# Patient Record
Sex: Male | Born: 1940 | Race: White | Hispanic: No | Marital: Married
Health system: Southern US, Community
[De-identification: ages and names within clinical notes are randomized; demographics above are authoritative.]

---

## 1999-11-13 ENCOUNTER — Inpatient Hospital Stay (HOSPITAL_COMMUNITY): Admission: EM | Admit: 1999-11-13 | Discharge: 1999-11-17 | Payer: Self-pay | Admitting: *Deleted

## 2002-03-19 ENCOUNTER — Emergency Department (HOSPITAL_COMMUNITY): Admission: EM | Admit: 2002-03-19 | Discharge: 2002-03-19 | Payer: Self-pay | Admitting: Emergency Medicine

## 2003-12-31 ENCOUNTER — Emergency Department (HOSPITAL_COMMUNITY): Admission: EM | Admit: 2003-12-31 | Discharge: 2004-01-01 | Payer: Self-pay | Admitting: Emergency Medicine

## 2007-07-28 ENCOUNTER — Ambulatory Visit: Payer: Self-pay | Admitting: Gastroenterology

## 2007-08-14 ENCOUNTER — Ambulatory Visit: Payer: Self-pay | Admitting: Gastroenterology

## 2010-11-06 NOTE — Discharge Summary (Signed)
. [facility]  Patient:    Cody Bates, Cody Bates                     MRN: [id number] Adm. Date:  [id number] Disc. Date: 11/16/99 Attending:  Learta Codding Dictator:   Leonides Cave, P.A. CC:         Lewayne Bunting, M.D., [facility] Cardiology             Bradd Canary, M.D., Primary Care                           Discharge Summary  DISCHARGE DIAGNOSES: 1. Noncardiac chest pain. (Patient with no coronary artery disease on heart    catheterization on 11/16/99). 2. Remote tobacco.  BRIEF HISTORY:  [age]-year-old male with no prior cardiac history who presents to [facility] on 11/13/99 with new onset of chest pain, which radiates to the back and having associated diaphoresis.  While at work, around 7 a.m. on the morning of admission, he developed sudden, sharp, mid-sternal chest pain. Duration was several hours and he finally decided to come to the emergency department. Admission EKG revealed normal sinus rhythm of 64, normal axis, and nonspecific T changes.  His third EKG, however, has slightly elevated ST in V2 and V3.  Chest CT scan was negative for pulmonary emboli and initial CPK and CK/MBs were negative; however, his troponin I was elevated at 0.24.  Patient was admitted for possible ischemic heart disease and was kept stable over the weekend at [facility].  HOSPITAL COURSE:  Patient was transferred to [facility] on the morning of 11/16/99 and was taken to the cath lab by Dr. Lewayne Bunting, [facility] cardiologist.  Results are LAD, left circ, and RCA all with no flow-limiting coronary artery disease.  Ejection fraction was normal at 60%.  Patient was subsequently discharged home later on the evening of 11/16/99 in stable condition.  Patient was discharged home on no new medications and he was on no medications at the time of admission. Patient was told to undergo no heavy lifting, driving, or sexual activity for two hours  post-catheterization.  He was told that if bleeding or swelling occurred t the catheterization site, he is to call [facility] cardiology office immediately. He was told to call the [facility] cardiology office for a P.A. followup in one week or groin check and call his primary care physician, Dr. Artis Flock, in two to three weeks for a post-hospital followup as well. DD:  11/16/99 TD:  11/16/99 Job: [id number]

## 2010-11-06 NOTE — Cardiovascular Report (Signed)
[place]. [facility]  Patient:    Cody Bates, Cody Bates                       MRN: [id number] Proc. Date: 11/16/99 Attending:  Lewayne Bunting, M.D. CC:         Luanna Salk, M.D., phone [phone]             Gerrit Friends. Dietrich Pates, M.D. [facility]                        Cardiac Catheterization  DATE OF BIRTH:  [birth year]  REFERRING PHYSICIAN:  Quita Skye. Artis Flock, M.D.  CARDIOLOGIST:  Gerrit Friends. Dietrich Pates, M.D.  PROCEDURES PERFORMED: 1. Left heart catheterization. 2. Ventriculography.  DIAGNOSES: 1. No flow-limiting epicardial coronary artery disease. 2. Separate ostia for left anterior descending and circumflex coronary artery. 3. Normal left ventricular filling pressures.  HISTORY:  Mr. Gravlin is a 70 year old white male with no significant past medical history.  The patient does have a positive family history for coronary artery disease.  He was admitted to the [facility] Emergency Room with atypical chest pain and positive troponin markers.  The patient is now being referred for diagnostic catheterization to access his coronary anatomy.  DESCRIPTION OF PROCEDURE:  After informed consent was obtained, the patient was brought to the catheterization laboratory.  The right groin was sterilely prepped and draped.  Lidocaine 1% was used to infiltrate the right groin, and a 6 French arterial sheath was placed using the modified Seldinger technique. Subsequently a 6 [place] and JR4 catheters were used to engage the left and right coronary arteries.  However, the left anterior descending artery and circumflex coronary artery arose from separate ostia.  Subsequently the circumflex was selectively cannulated with a JL5 catheter.  After selective angiographies attention was turned to ventriculography.  A 6 French pigtail catheter was advanced via the femoral artery and placed in the left ventricle. Appropriate left-sided hemodynamics were obtained.  A left ventriculogram  was then performed using power injections of contrast.  The pigtail catheter was then pulled back.  At the termination of the case, the catheters and sheaths were removed and manual pressure applied until adequate hemostasis was achieved.  The patient tolerated the procedure well and was transferred to the short-stay unit in stable condition.  FINDINGS:  HEMODYNAMICS:  Left ventricular pressure 114 mmHg.  The aortic pressure 102/64 mmHg.  LEFT VENTRICULOGRAM:  Ejection fraction 60% without wall motion abnormalities. This was performed in single plane RAO projection.  SELECTIVE CORONARY ANGIOGRAPHY:  Of note is again a separate coronary ostia on the left side.  Left anterior descending artery had no flow-limiting epicardial coronary artery disease.  The left circumflex coronary artery had no flow-limiting coronary artery disease.  Right coronary artery was right dominant with no flow-limiting coronary artery disease.  CONCLUSIONS: 1. No flow-limiting epicardial coronary artery disease. 2. Normal left-sided filling pressures. 3. No obvious dissection of the proximal aorta.  RECOMMENDATIONS:  The patients chest pain syndrome was rather atypical.  He did have negative CK and CK-MB markers and mildly elevated troponin levels. The patient was ruled out for pulmonary embolus with a negative spiral CT scan.  However, I have recommended lower extremity venous Dopplers this afternoon.  If these are negative, the patient could be discharged. There is no evidence of flow-limiting epicardial coronary artery disease at the present time.  The patient should continue to modify his risk  factors for CAD. DD:  11/16/99 TD:  11/17/99 Job: [id number]

## 2010-11-06 NOTE — Discharge Summary (Signed)
. [facility]  Patient:    Cody Bates, Cody Bates                       MRN: [id number] Adm. Date:  11/13/99 Disc. Date: 11/17/99 Attending:  Gerrit Friends. Dietrich Pates, M.D. [facility] Dictator:   Gene Serpe, P.A. CC:         Quita Skye. Artis Flock, M.D.                           Discharge Summary  PROCEDURES:  Cardiac catheterization  Nov 16, 1999.  REASON FOR ADMISSION: [name] is a 70 year old male without prior history of heart disease, who initially presented to [facility] with new onset sharp chest pain exacerbated by deep inspiration.  Initial workup involved chest CT scan which was negative for pulmonary embolus.  No acute changes were noted on EKG; however, troponin levels were abnormal (0.24) and the patient was admitted for rule out MI and further diagnostic evaluation.  LABORATORY DATA:  Room ABG:  pH 7.4, pCO2 40, pO2 77, HCO3 25.  Admission CBC normal.  Metabolic profile on admission normal.  Cardiac enzymes:  CK/MB negative times three; serial troponin I:  0.24, 0.20, 0.16.   Lipid profile: Cholesterol 209, triglyceride 274, HDL 27, LDL 127.  Admission chest x-ray:  No acute disease.  HOSPITAL COURSE:  Following admission, serial cardiac enzymes revealed negative CPK levels with abnormal troponin I levels (peak 0.24).  The patient was maintained on Lovenox in preparation to proceed with coronary angiogram. Initial workup at [facility] notable for negative chest CT scan and no evidence of DVT.  Cardiac catheterization on Nov 16, 1999 by Dr. Andee Lineman (see report for full details), revealed normal coronary arteries/normal left ventricle.  The patient was cleared for discharge the following morning and will follow up in one week for groin check.  No new medications were added.  DISCHARGE MEDICATIONS:  Darvocet p.r.n.  DISCHARGE INSTRUCTIONS:  The patient is to refrain from heavy lifting/driving times two days and is to maintain low  fat/cholesterol diet. He is to call our office if there is any swelling/bleeding in the groin.  The patient is instructed to follow up with the [facility] in one week for groin check.  Will follow up with Dr. Artis Flock in 2-3 weeks.  DISCHARGE DIAGNOSES: 1. Noncardiac chest pain.    a. Normal coronary angiogram Nov 16, 1999.    b. Abnormal troponin I levels. 2. Mild hypoxemia.    a. Negative chest computerized tomography for pulmonary embolus. 3. Remote tobacco. DD:  11/17/99 TD:  11/17/99 Job: [id number]

## 2015-01-29 ENCOUNTER — Encounter: Payer: Self-pay | Admitting: Gastroenterology

## 2017-07-21 ENCOUNTER — Encounter: Payer: Self-pay | Admitting: Gastroenterology

## 2019-04-27 ENCOUNTER — Other Ambulatory Visit: Payer: Self-pay

## 2019-04-27 DIAGNOSIS — Z20822 Contact with and (suspected) exposure to covid-19: Secondary | ICD-10-CM

## 2019-04-28 LAB — NOVEL CORONAVIRUS, NAA: SARS-CoV-2, NAA: NOT DETECTED

## 2020-02-22 ENCOUNTER — Encounter (HOSPITAL_COMMUNITY): Payer: Self-pay | Admitting: Physician Assistant

## 2020-02-22 ENCOUNTER — Other Ambulatory Visit: Payer: Self-pay

## 2020-02-22 ENCOUNTER — Emergency Department (HOSPITAL_COMMUNITY)
Admission: EM | Admit: 2020-02-22 | Discharge: 2020-02-22 | Disposition: A | Discharge status: Home / Self Care | Payer: Medicare Other | Attending: Emergency Medicine | Admitting: Emergency Medicine

## 2020-02-22 ENCOUNTER — Emergency Department (HOSPITAL_COMMUNITY): Payer: Medicare Other

## 2020-02-22 DIAGNOSIS — E860 Dehydration: Secondary | ICD-10-CM | POA: Diagnosis not present

## 2020-02-22 DIAGNOSIS — Z7984 Long term (current) use of oral hypoglycemic drugs: Secondary | ICD-10-CM | POA: Diagnosis not present

## 2020-02-22 DIAGNOSIS — Z79899 Other long term (current) drug therapy: Secondary | ICD-10-CM | POA: Insufficient documentation

## 2020-02-22 DIAGNOSIS — U071 COVID-19: Secondary | ICD-10-CM | POA: Diagnosis not present

## 2020-02-22 DIAGNOSIS — I959 Hypotension, unspecified: Secondary | ICD-10-CM | POA: Diagnosis present

## 2020-02-22 DIAGNOSIS — E119 Type 2 diabetes mellitus without complications: Secondary | ICD-10-CM | POA: Diagnosis not present

## 2020-02-22 LAB — PROCALCITONIN: Procalcitonin: <0.1 ng/mL

## 2020-02-22 LAB — CBC WITH DIFFERENTIAL/PLATELET
Abs Immature Granulocytes: 0.12 10*3/uL — ABNORMAL HIGH (ref 0.00–0.07)
Basophils Absolute: 0 10*3/uL (ref 0.0–0.1)
Basophils Relative: 0 %
Eosinophils Absolute: 0 10*3/uL (ref 0.0–0.5)
Eosinophils Relative: 0 %
HCT: 35.7 % — ABNORMAL LOW (ref 39.0–52.0)
Hemoglobin: 11.8 g/dL — ABNORMAL LOW (ref 13.0–17.0)
Immature Granulocytes: 1 %
Lymphocytes Relative: 11 %
Lymphs Abs: 1 10*3/uL (ref 0.7–4.0)
MCH: 30.7 pg (ref 26.0–34.0)
MCHC: 33.1 g/dL (ref 30.0–36.0)
MCV: 93 fL (ref 80.0–100.0)
Monocytes Absolute: 0.8 10*3/uL (ref 0.1–1.0)
Monocytes Relative: 10 %
Neutro Abs: 6.7 10*3/uL (ref 1.7–7.7)
Neutrophils Relative %: 78 %
Platelets: 249 10*3/uL (ref 150–400)
RBC: 3.84 MIL/uL — ABNORMAL LOW (ref 4.22–5.81)
RDW: 12.9 % (ref 11.5–15.5)
WBC: 8.7 10*3/uL (ref 4.0–10.5)
nRBC: 0 % (ref 0.0–0.2)

## 2020-02-22 LAB — URINALYSIS, ROUTINE W REFLEX MICROSCOPIC
Bilirubin Urine: NEGATIVE
Glucose, UA: NEGATIVE mg/dL
Hgb urine dipstick: NEGATIVE
Ketones, ur: 5 mg/dL — AB
Leukocytes,Ua: NEGATIVE
Nitrite: NEGATIVE
Protein, ur: NEGATIVE mg/dL
Specific Gravity, Urine: 1.006 (ref 1.005–1.030)
pH: 7 (ref 5.0–8.0)

## 2020-02-22 LAB — COMPREHENSIVE METABOLIC PANEL
ALT: 38 U/L (ref 0–44)
AST: 39 U/L (ref 15–41)
Albumin: 2.5 g/dL — ABNORMAL LOW (ref 3.5–5.0)
Alkaline Phosphatase: 137 U/L — ABNORMAL HIGH (ref 38–126)
Anion gap: 13 (ref 5–15)
BUN: 7 mg/dL — ABNORMAL LOW (ref 8–23)
CO2: 18 mmol/L — ABNORMAL LOW (ref 22–32)
Calcium: 8.2 mg/dL — ABNORMAL LOW (ref 8.9–10.3)
Chloride: 101 mmol/L (ref 98–111)
Creatinine, Ser: 1.06 mg/dL (ref 0.61–1.24)
GFR calc Af Amer: >60 mL/min (ref >60)
GFR calc non Af Amer: >60 mL/min (ref >60)
Glucose, Bld: 135 mg/dL — ABNORMAL HIGH (ref 70–99)
Potassium: 4.1 mmol/L (ref 3.5–5.1)
Sodium: 132 mmol/L — ABNORMAL LOW (ref 135–145)
Total Bilirubin: 1.2 mg/dL (ref 0.3–1.2)
Total Protein: 6.5 g/dL (ref 6.5–8.1)

## 2020-02-22 LAB — LACTIC ACID, PLASMA: Lactic Acid, Venous: 1 mmol/L (ref 0.5–1.9)

## 2020-02-22 LAB — CBG MONITORING, ED: Glucose-Capillary: 123 mg/dL — ABNORMAL HIGH (ref 70–99)

## 2020-02-22 LAB — LACTATE DEHYDROGENASE: LDH: 172 U/L (ref 98–192)

## 2020-02-22 LAB — C-REACTIVE PROTEIN: CRP: 19.7 mg/dL — ABNORMAL HIGH (ref <1.0)

## 2020-02-22 LAB — D-DIMER, QUANTITATIVE: D-Dimer, Quant: 1.1 ug/mL-FEU — ABNORMAL HIGH (ref 0.00–0.50)

## 2020-02-22 LAB — FIBRINOGEN: Fibrinogen: 713 mg/dL — ABNORMAL HIGH (ref 210–475)

## 2020-02-22 LAB — TRIGLYCERIDES: Triglycerides: 97 mg/dL (ref <150)

## 2020-02-22 LAB — FERRITIN: Ferritin: 883 ng/mL — ABNORMAL HIGH (ref 24–336)

## 2020-02-22 MED ORDER — LACTATED RINGERS IV BOLUS: 2000.0000 mL | Freq: Once | INTRAVENOUS | Status: DC

## 2020-02-22 MED ORDER — VANCOMYCIN HCL IN DEXTROSE 1-5 GM/200ML-% IV SOLN: 1000.0000 mg | Freq: Two times a day (BID) | INTRAVENOUS | Status: DC

## 2020-02-22 MED ORDER — SODIUM CHLORIDE 0.9 % IV SOLN: 2.0000 g | Freq: Once | INTRAVENOUS | Status: DC

## 2020-02-22 MED ORDER — LACTATED RINGERS IV BOLUS
1000.0000 mL | Freq: Once | INTRAVENOUS | Status: AC
  Administered 2020-02-22: 1000 mL via INTRAVENOUS

## 2020-02-22 MED ORDER — VANCOMYCIN HCL IN DEXTROSE 1-5 GM/200ML-% IV SOLN: 1000.0000 mg | Freq: Once | INTRAVENOUS | Status: DC

## 2020-02-22 MED ORDER — SODIUM CHLORIDE 0.9 % IV SOLN: 2.0000 g | Freq: Three times a day (TID) | INTRAVENOUS | Status: DC

## 2020-02-22 MED ORDER — VANCOMYCIN HCL 1500 MG/300ML IV SOLN
1500.0000 mg | Freq: Once | INTRAVENOUS | Status: DC
  Filled 2020-02-22: qty 300

## 2020-02-22 MED ORDER — SODIUM CHLORIDE 0.9 % IV BOLUS
500.0000 mL | Freq: Once | INTRAVENOUS | Status: AC
  Administered 2020-02-22: 500 mL via INTRAVENOUS

## 2020-02-22 NOTE — Discharge Instructions (Signed)
Please follow up with your primary care provider within 2-3 days for re-evaluation of your symptoms. If you do not have a primary care provider, information for a healthcare clinic has been provided for you to make arrangements for follow up care. Please return to the emergency department for any new or worsening symptoms. ° °

## 2020-02-22 NOTE — Progress Notes (Signed)
Pharmacy Antibiotic Note  Cody Bates is a 79 y.o. male admitted on 02/22/2020 with sepsis.  Pharmacy has been consulted for vancomycin and cefepime dosing.  Plan: Vancomycin 1500 mg IV x 1, then 1000 mg Iv every 12 hours (target vancomycin trough 15-20) Cefepime 2g IV every 8 hours Monitor renal function, Cx and clinical progression to narrow Vancomycin level as needed      Temp (24hrs), Avg:98.6 F (37 C), Min:98.6 F (37 C), Max:98.6 F (37 C)  Recent Labs  Lab 02/22/20 1056  WBC 8.7  CREATININE 1.06  LATICACIDVEN 1.0    CrCl cannot be calculated (Unknown ideal weight.).    No Known Allergies  Daylene Posey, PharmD Clinical Pharmacist ED Pharmacist Phone # [phone] 02/22/2020 12:59 PM

## 2020-02-22 NOTE — ED Triage Notes (Signed)
Arrived via EMS from home. Reported covid + x 10 days; c/o low BP and feels dehydrated.

## 2020-02-22 NOTE — ED Triage Notes (Signed)
EMS reported initial systolic BP of 82 mm hg and improved to 103/60 afteer 400 ml NS bolus.

## 2020-02-22 NOTE — ED Notes (Signed)
Oxygen 95% while ambulating  

## 2020-02-22 NOTE — ED Provider Notes (Signed)
[facility] EMERGENCY DEPARTMENT Provider Note   CSN: [id number] Arrival date & time: 02/22/20  1009     History Chief Complaint  Patient presents with  . Hypotension    Cody Bates is a 79 y.o. male.  HPI   79 year old male with a history of arthritis, cataracts, diabetes, who presents to the emergency department today for evaluation of low blood pressure.  Additional history obtained from EMS.  They state patient is is Covid positive and on day 10 of symptoms.  He states they were called out for hypotension.  On their arrival BP was in the 80s systolic.  They gave 400 cc of fluids and sats improved to 100 systolic.  His O2 sats were 92% on room air.  He was not complaining of any chest pain or shortness of breath.  Patient tells me that he feels dehydrated and like he needs fluids.  He has had no vomiting or diarrhea.  He denies any chest pain, shortness of breath.  He has had a nonproductive cough.  He denies any fevers.  History reviewed. No pertinent past medical history.  There are no problems to display for this patient.   History reviewed. No pertinent surgical history.     History reviewed. No pertinent family history.  Social History   Tobacco Use  . Smoking status: Not on file  Substance Use Topics  . Alcohol use: Not on file  . Drug use: Not on file    Home Medications Prior to Admission medications   Medication Sig Start Date End Date Taking? Authorizing Provider  Carboxymethylcellulose Sodium 0.25 % SOLN Apply 1 drop to eye daily as needed.   Yes [provider]  Cholecalciferol (VITAMIN D3) 1.25 MG (50000 UT) CAPS Take 1 capsule by mouth daily.   Yes [provider]  metFORMIN (GLUCOPHAGE) 500 MG tablet Take 500 mg by mouth in the morning and at bedtime.   Yes [provider]  Multiple Vitamin (MULTI-VITAMIN) tablet Take 1 tablet by mouth daily.   Yes [provider]    Allergies    Patient  has no known allergies.  Review of Systems   Review of Systems  Constitutional: Positive for fatigue. Negative for fever.  HENT: Negative for ear pain and sore throat.   Eyes: Negative for pain and visual disturbance.  Respiratory: Positive for cough. Negative for shortness of breath.   Cardiovascular: Negative for chest pain.  Gastrointestinal: Negative for abdominal pain, constipation, diarrhea, nausea and vomiting.  Genitourinary: Negative for dysuria and hematuria.  Musculoskeletal: Negative for back pain.  Skin: Negative for rash.  Neurological: Negative for headaches.  All other systems reviewed and are negative.   Physical Exam Updated Vital Signs BP 121/69   Pulse 81   Temp 98.6 F (37 C) (Oral)   Resp (!) 24   SpO2 95%   Physical Exam Vitals and nursing note reviewed.  Constitutional:      Appearance: He is well-developed.     Comments: Thin appearing male  HENT:     Head: Normocephalic and atraumatic.  Eyes:     Conjunctiva/sclera: Conjunctivae normal.  Cardiovascular:     Rate and Rhythm: Normal rate and regular rhythm.     Heart sounds: Normal heart sounds. No murmur heard.   Pulmonary:     Effort: Pulmonary effort is normal. No respiratory distress.     Breath sounds: Rales (bibasilar rales (R>L)) present.  Abdominal:     General: Bowel sounds  are normal.     Palpations: Abdomen is soft.     Tenderness: There is no abdominal tenderness.  Musculoskeletal:     Cervical back: Neck supple.  Skin:    General: Skin is warm and dry.  Neurological:     Mental Status: He is alert.     ED Results / Procedures / Treatments   Labs (all labs ordered are listed, but only abnormal results are displayed) Labs Reviewed  CBC WITH DIFFERENTIAL/PLATELET - Abnormal; Notable for the following components:      Result Value   RBC 3.84 (*)    Hemoglobin 11.8 (*)    HCT 35.7 (*)    Abs Immature Granulocytes 0.12 (*)    All other components within normal limits   COMPREHENSIVE METABOLIC PANEL - Abnormal; Notable for the following components:   Sodium 132 (*)    CO2 18 (*)    Glucose, Bld 135 (*)    BUN 7 (*)    Calcium 8.2 (*)    Albumin 2.5 (*)    Alkaline Phosphatase 137 (*)    All other components within normal limits  D-DIMER, QUANTITATIVE (NOT AT [facility]) - Abnormal; Notable for the following components:   D-Dimer, Quant 1.10 (*)    All other components within normal limits  FERRITIN - Abnormal; Notable for the following components:   Ferritin 883 (*)    All other components within normal limits  FIBRINOGEN - Abnormal; Notable for the following components:   Fibrinogen 713 (*)    All other components within normal limits  C-REACTIVE PROTEIN - Abnormal; Notable for the following components:   CRP 19.7 (*)    All other components within normal limits  URINALYSIS, ROUTINE W REFLEX MICROSCOPIC - Abnormal; Notable for the following components:   Ketones, ur 5 (*)    All other components within normal limits  CBG MONITORING, ED - Abnormal; Notable for the following components:   Glucose-Capillary 123 (*)    All other components within normal limits  CULTURE, BLOOD (ROUTINE X 2)  CULTURE, BLOOD (ROUTINE X 2)  URINE CULTURE  LACTIC ACID, PLASMA  PROCALCITONIN  LACTATE DEHYDROGENASE  TRIGLYCERIDES    EKG EKG Interpretation  Date/Time:  Friday February 22 2020 10:29:58 EDT Ventricular Rate:  73 PR Interval:    QRS Duration: 93 QT Interval:  374 QTC Calculation: 413 R Axis:   51 Text Interpretation: Sinus rhythm Abnormal R-wave progression, early transition No significant change since last tracing Confirmed by Alvira Monday ([id number]) on 02/22/2020 12:38:06 PM   Radiology DG Chest Port 1 View  Result Date: 02/22/2020 CLINICAL DATA:  COVID EXAM: PORTABLE CHEST 1 VIEW COMPARISON:  None. FINDINGS: The heart size and mediastinal contours are within normal limits. No consolidation. Mild interstitial prominence. The visualized skeletal  structures are unremarkable. IMPRESSION: Mild nonspecific interstitial thickening, which may represent chronic bronchitic changes, mild interstitial edema, or atypical infection. No consolidation. Electronically Signed   By: Feliberto Harts MD   On: 02/22/2020 10:50    Procedures Procedures (including critical care time)  Medications Ordered in ED Medications  sodium chloride 0.9 % bolus 500 mL (0 mLs Intravenous Stopped 02/22/20 1300)  lactated ringers bolus 1,000 mL (1,000 mLs Intravenous New Bag/Given 02/22/20 1530)    ED Course  I have reviewed the triage vital signs and the nursing notes.  Pertinent labs & imaging results that were available during my care of the patient were reviewed by me and considered in my medical decision making (see  chart for details).    MDM Rules/Calculators/A&P                          79 y/o M with COVID presenting for eval of dehydration and hypotension  Noted to have systolic BP in the 80s with EMS. Received 400cc bolus with them and BP improved to low 100s. sats 96 on RA.   Reviewed/interpreted labs CBC w/o leukocytosis, mild anemia present CMP with mild hyponatremia, bicarb low at 18, creatinine normal and LFTs are normal. Lactic negative Inflammatory markers are somewhat elevated UA with ketonuria but no uti Blood cultures obtained  EKG with NSR, abnormal rwave progression  CXR personally reviewed/interpreted - Mild nonspecific interstitial thickening, which may represent chronic bronchitic changes, mild interstitial edema, or atypical infection. No consolidation.  I reassessed the patient after he received IV fluids and he states he is feeling much better and back to baseline at this time.  He has been able to tolerate Sprite as well as a sandwich without any difficulty.  I suspect that his symptoms today are related simply to dehydration from being ill with Covid.  He is show no evidence of hypoxia today and was able to ambulate with sats  above 94% on room air.  He does not complaining of any chest pain or shortness of breath and he looks well after receiving fluids.  I feel that he is appropriate for discharge.  He does not qualify for monoclonal antibodies as he is out of the window for receiving this.  Have recommended continued supportive therapy and that he push fluids for the next several days.  Have advised close follow-up with his provider at the [facility] and strict return precautions.  He voices understanding of plan and reasons to return.  Questions answered patient stable for discharge.   Case was discussed with Dr. Dalene Seltzer who is in agreement with the plan for discharge.  Final Clinical Impression(s) / ED Diagnoses Final diagnoses:  COVID-19  Dehydration    Rx / DC Orders ED Discharge Orders    None       Karrie Meres, PA-C 02/22/20 1626    Alvira Monday, MD 02/25/20 1034

## 2020-02-24 LAB — URINE CULTURE: Culture: 10000 — AB

## 2020-02-25 ENCOUNTER — Telehealth: Payer: Self-pay | Admitting: Emergency Medicine

## 2020-02-25 NOTE — Telephone Encounter (Signed)
Post ED Visit - Positive Culture Follow-up  Culture report reviewed by antimicrobial stewardship pharmacist: [facility] []  , Pharm.D. []  Enzo Bi, Pharm.D., BCPS AQ-ID []  , Pharm.D., BCPS []  Celedonio Miyamoto, Pharm.D., BCPS []  Sharon, Garvin Fila.D., BCPS, AAHIVP []  , Pharm.D., BCPS, AAHIVP []  Georgina Pillion, PharmD, BCPS []  , PharmD, BCPS []  [place], PharmD, BCPS []  [place], PharmD []  , PharmD, BCPS []  Estella Husk, PharmD PharmD  [facility] []  , PharmD []  Phillips Climes, PharmD []  , PharmD []  Agapito Games, Rph []  ) Verlan Friends, PharmD []  , PharmD []  Mervyn Gay, PharmD []  , PharmD []  Vinnie Level, PharmD []  Sharen Hones, PharmD []  Wonda [facility], PharmD []  , PharmD []  Len Childs, PharmD   Positive urine culture Treated with none, asymptomatic,  no further patient follow-up is required at this time.  02/25/2020, 12:00 PM

## 2020-02-27 LAB — CULTURE, BLOOD (ROUTINE X 2)
Culture: NO GROWTH
Culture: NO GROWTH
Special Requests: ADEQUATE
Special Requests: ADEQUATE

## 2021-05-07 IMAGING — DX DG CHEST 1V PORT
1 series · 1 of 1 positions shown · non-contrast
Comparison: None.

CLINICAL DATA: COVID

EXAM:
PORTABLE CHEST 1 VIEW

[chest]
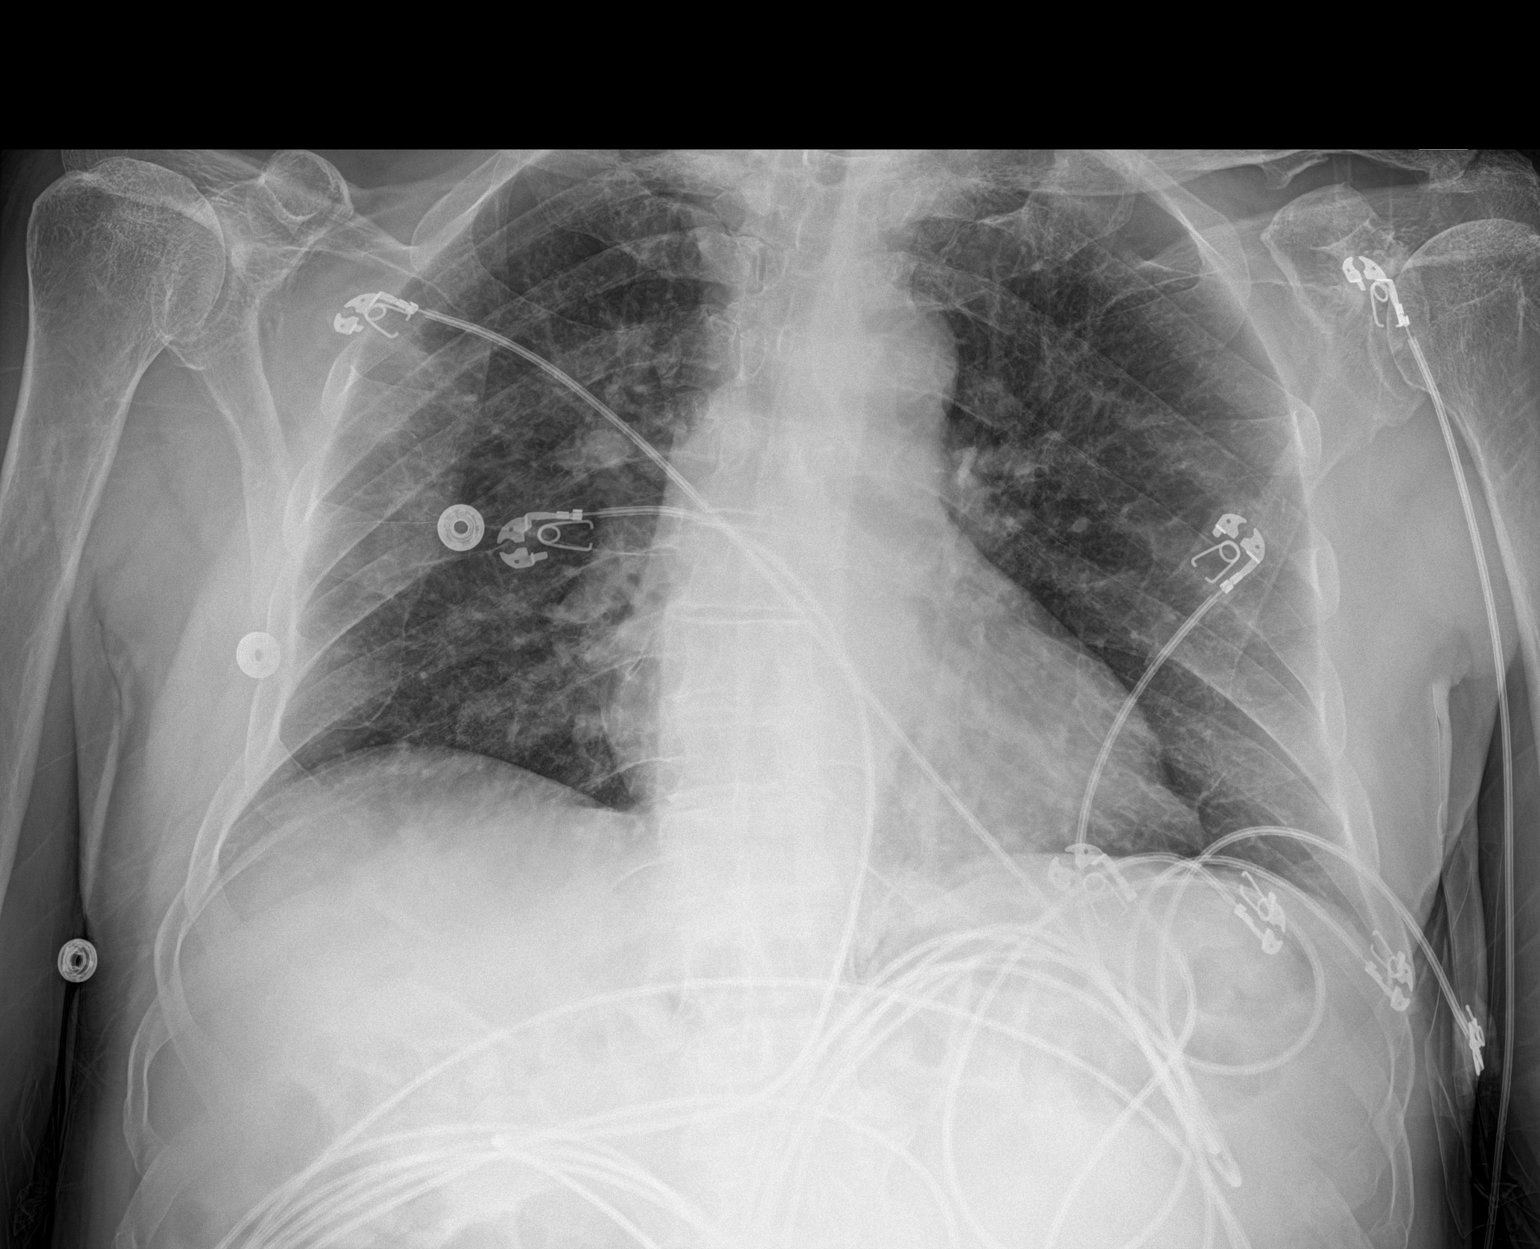

[1 of 1 positions shown; findings below may reference images not displayed]

FINDINGS: The heart size and mediastinal contours are within normal limits. No
consolidation. Mild interstitial prominence. The visualized skeletal
structures are unremarkable.
IMPRESSION: Mild nonspecific interstitial thickening, which may represent
chronic bronchitic changes, mild interstitial edema, or atypical
infection. No consolidation.

## 2022-04-05 DIAGNOSIS — H26493 Other secondary cataract, bilateral: Secondary | ICD-10-CM | POA: Diagnosis not present

## 2023-01-31 DIAGNOSIS — L821 Other seborrheic keratosis: Secondary | ICD-10-CM | POA: Diagnosis not present

## 2023-01-31 DIAGNOSIS — L814 Other melanin hyperpigmentation: Secondary | ICD-10-CM | POA: Diagnosis not present

## 2023-01-31 DIAGNOSIS — L570 Actinic keratosis: Secondary | ICD-10-CM | POA: Diagnosis not present

## 2023-02-16 DIAGNOSIS — Z0000 Encounter for general adult medical examination without abnormal findings: Secondary | ICD-10-CM | POA: Diagnosis not present

## 2023-03-16 DIAGNOSIS — E119 Type 2 diabetes mellitus without complications: Secondary | ICD-10-CM | POA: Diagnosis not present

## 2023-03-16 DIAGNOSIS — G8929 Other chronic pain: Secondary | ICD-10-CM | POA: Diagnosis not present

## 2023-03-16 DIAGNOSIS — E785 Hyperlipidemia, unspecified: Secondary | ICD-10-CM | POA: Diagnosis not present

## 2023-03-16 DIAGNOSIS — M25561 Pain in right knee: Secondary | ICD-10-CM | POA: Diagnosis not present

## 2023-03-17 DIAGNOSIS — M25561 Pain in right knee: Secondary | ICD-10-CM | POA: Diagnosis not present

## 2023-05-05 DIAGNOSIS — H04123 Dry eye syndrome of bilateral lacrimal glands: Secondary | ICD-10-CM | POA: Diagnosis not present
